# Patient Record
Sex: Female | Born: 1970 | Race: White | Hispanic: No | Marital: Single | Smoking: Never smoker
Health system: Southern US, Community
[De-identification: ages and names within clinical notes are randomized; demographics above are authoritative.]

## PROBLEM LIST (undated history)

## (undated) DIAGNOSIS — I10 Essential (primary) hypertension: Secondary | ICD-10-CM

## (undated) DIAGNOSIS — N200 Calculus of kidney: Secondary | ICD-10-CM

## (undated) HISTORY — PX: APPENDECTOMY: SHX54

## (undated) HISTORY — PX: CHOLECYSTECTOMY: SHX55

## (undated) HISTORY — PX: TONSILLECTOMY: SUR1361

---

## 2020-04-29 ENCOUNTER — Emergency Department (HOSPITAL_COMMUNITY)
Admission: EM | Admit: 2020-04-29 | Discharge: 2020-04-29 | Disposition: A | Discharge status: Home / Self Care | Payer: PRIVATE HEALTH INSURANCE | Attending: Emergency Medicine | Admitting: Emergency Medicine

## 2020-04-29 ENCOUNTER — Encounter (HOSPITAL_COMMUNITY): Payer: Self-pay | Admitting: Emergency Medicine

## 2020-04-29 ENCOUNTER — Emergency Department (HOSPITAL_COMMUNITY): Payer: PRIVATE HEALTH INSURANCE

## 2020-04-29 ENCOUNTER — Other Ambulatory Visit: Payer: Self-pay

## 2020-04-29 DIAGNOSIS — Z79899 Other long term (current) drug therapy: Secondary | ICD-10-CM | POA: Insufficient documentation

## 2020-04-29 DIAGNOSIS — R1032 Left lower quadrant pain: Secondary | ICD-10-CM | POA: Insufficient documentation

## 2020-04-29 DIAGNOSIS — Z87442 Personal history of urinary calculi: Secondary | ICD-10-CM | POA: Insufficient documentation

## 2020-04-29 DIAGNOSIS — R109 Unspecified abdominal pain: Secondary | ICD-10-CM

## 2020-04-29 DIAGNOSIS — I10 Essential (primary) hypertension: Secondary | ICD-10-CM | POA: Insufficient documentation

## 2020-04-29 HISTORY — DX: Essential (primary) hypertension: I10

## 2020-04-29 HISTORY — DX: Calculus of kidney: N20.0

## 2020-04-29 LAB — CBC
HCT: 30.5 % — ABNORMAL LOW (ref 36.0–46.0)
Hemoglobin: 8.6 g/dL — ABNORMAL LOW (ref 12.0–15.0)
MCH: 21.4 pg — ABNORMAL LOW (ref 26.0–34.0)
MCHC: 28.2 g/dL — ABNORMAL LOW (ref 30.0–36.0)
MCV: 75.9 fL — ABNORMAL LOW (ref 80.0–100.0)
Platelets: 298 10*3/uL (ref 150–400)
RBC: 4.02 MIL/uL (ref 3.87–5.11)
RDW: 17.2 % — ABNORMAL HIGH (ref 11.5–15.5)
WBC: 4.2 10*3/uL (ref 4.0–10.5)
nRBC: 0 % (ref 0.0–0.2)

## 2020-04-29 LAB — BASIC METABOLIC PANEL
Anion gap: 10 (ref 5–15)
BUN: 10 mg/dL (ref 6–20)
CO2: 23 mmol/L (ref 22–32)
Calcium: 9.2 mg/dL (ref 8.9–10.3)
Chloride: 104 mmol/L (ref 98–111)
Creatinine, Ser: 0.99 mg/dL (ref 0.44–1.00)
GFR calc Af Amer: >60 mL/min (ref >60)
GFR calc non Af Amer: >60 mL/min (ref >60)
Glucose, Bld: 109 mg/dL — ABNORMAL HIGH (ref 70–99)
Potassium: 4.3 mmol/L (ref 3.5–5.1)
Sodium: 137 mmol/L (ref 135–145)

## 2020-04-29 LAB — URINALYSIS, ROUTINE W REFLEX MICROSCOPIC
Bilirubin Urine: NEGATIVE
Glucose, UA: NEGATIVE mg/dL
Ketones, ur: NEGATIVE mg/dL
Leukocytes,Ua: NEGATIVE
Nitrite: NEGATIVE
Protein, ur: 30 mg/dL — AB
RBC / HPF: >50 RBC/hpf — ABNORMAL HIGH (ref 0–5)
Specific Gravity, Urine: 1.002 — ABNORMAL LOW (ref 1.005–1.030)
pH: 6 (ref 5.0–8.0)

## 2020-04-29 MED ORDER — OXYCODONE-ACETAMINOPHEN 5-325 MG PO TABS
1.0000 | ORAL_TABLET | Freq: Once | ORAL | Status: AC
  Administered 2020-04-29: 1 via ORAL
  Filled 2020-04-29: qty 1

## 2020-04-29 MED ORDER — OXYCODONE-ACETAMINOPHEN 5-325 MG PO TABS: 1.0000 | ORAL_TABLET | Freq: Four times a day (QID) | ORAL | 0 refills | Status: AC | PRN

## 2020-04-29 MED ORDER — ACETAMINOPHEN 500 MG PO TABS: 500.0000 mg | ORAL_TABLET | Freq: Four times a day (QID) | ORAL | 0 refills | Status: AC | PRN

## 2020-04-29 MED ORDER — FENTANYL CITRATE (PF) 100 MCG/2ML IJ SOLN
50.0000 ug | Freq: Once | INTRAMUSCULAR | Status: AC
  Administered 2020-04-29: 50 ug via INTRAVENOUS
  Filled 2020-04-29: qty 2

## 2020-04-29 MED ORDER — NAPROXEN 500 MG PO TABS: 500.0000 mg | ORAL_TABLET | Freq: Two times a day (BID) | ORAL | 0 refills | Status: AC | PRN

## 2020-04-29 MED ORDER — FERROUS SULFATE 325 (65 FE) MG PO TABS: 325.0000 mg | ORAL_TABLET | Freq: Every day | ORAL | 0 refills | Status: AC

## 2020-04-29 MED ORDER — KETOROLAC TROMETHAMINE 30 MG/ML IJ SOLN
30.0000 mg | Freq: Once | INTRAMUSCULAR | Status: AC
  Administered 2020-04-29: 30 mg via INTRAVENOUS
  Filled 2020-04-29: qty 1

## 2020-04-29 NOTE — Discharge Instructions (Addendum)
Please take your medications, as prescribed.  Please take the Percocet only if your pain is uncontrolled by naproxen and Tylenol.  I have also prescribed you ferrous sulfate for your anemia here in the ED.  Given that we are uncertain as to your baseline, I would like for you to call your primary care provider back in [place] to assess what your current hemoglobin of 8.6 is significantly below your baseline.  If it is, you may need to return to the ED for further evaluation.  However, given that you do not have any dark black stools or recent menses, the etiology would be unclear.  I would like for you to have your laboratory values rechecked in 3 to 5 days to ensure that there is improvement with the ferrous sulfate.  Return to the ED or seek immediate medical attention should he develop any new or worsening symptoms.  You were given narcotic and or sedative medications while in the emergency department. Do not drive. Do not use machinery or power tools. Do not sign legal documents. Do not drink alcohol. Do not take sleeping pills. Do not supervise children by yourself. Do not participate in activities that require climbing or being in high places.

## 2020-04-29 NOTE — ED Triage Notes (Signed)
C/o L flank pain since yesterday with hematuria.  History of kidney stones.  Denies dysuria.

## 2020-04-29 NOTE — ED Provider Notes (Signed)
[facility] EMERGENCY DEPARTMENT Provider Note   CSN: [id number] Arrival date & time: 04/29/20  1121     History Chief Complaint  Patient presents with   Flank Pain    Amanda Hensley is a 49 y.o. female with PMH significant for HTN and kidney stones presents to the ED with complaints of left-sided flank pain since yesterday.  Patient is not here for work from [place] and she states that she has had dozens and dozens of kidney stones throughout her life, beginning at the age of 55.  She is followed by a urologist in [place].  She states that her left-sided flank pain began yesterday morning and she was able to manage her symptoms with naproxen and some previously prescribed Percocet, however when she ran out she knew that she needed to come to the ED for evaluation and pain control.  She also notes that she has extensive history of abdominal surgeries, but last bowel movement was last night and soft, brown.  She denies any fevers or chills, chest pain or difficulty breathing, cough, nausea or vomiting, dysuria, vaginal discharge, pelvic pain, or change in bowel habits.  She states that her flank discomfort radiates into LLQ and is consistent with her history of stones.  HPI     Past Medical History:  Diagnosis Date   Hypertension    Kidney stones     There are no problems to display for this patient.   Past Surgical History:  Procedure Laterality Date   APPENDECTOMY     CHOLECYSTECTOMY     TONSILLECTOMY       OB History   No obstetric history on file.     No family history on file.  Social History   Tobacco Use   Smoking status: Never Smoker   Smokeless tobacco: Never Used  Substance Use Topics   Alcohol use: Yes   Drug use: Never    Home Medications Prior to Admission medications   Medication Sig Start Date End Date Taking? Authorizing Provider  lisinopril (ZESTRIL) 20 MG tablet Take 20 mg by mouth daily.   Yes [provider]  naproxen sodium (ALEVE) 220 MG tablet Take 440 mg by mouth as needed (pain).   Yes [provider]  acetaminophen (TYLENOL) 500 MG tablet Take 1 tablet (500 mg total) by mouth every 6 (six) hours as needed for moderate pain. 04/29/20   Lorelee New, PA-C  ferrous sulfate 325 (65 FE) MG tablet Take 1 tablet (325 mg total) by mouth daily. 04/29/20   Lorelee New, PA-C  naproxen (NAPROSYN) 500 MG tablet Take 1 tablet (500 mg total) by mouth 2 (two) times daily as needed for moderate pain. 04/29/20   Lorelee New, PA-C  oxyCODONE-acetaminophen (PERCOCET/ROXICET) 5-325 MG tablet Take 1 tablet by mouth every 6 (six) hours as needed for up to 5 doses for severe pain. 04/29/20   Lorelee New, PA-C    Allergies    Patient has no known allergies.  Review of Systems   Review of Systems  All other systems reviewed and are negative.   Physical Exam Updated Vital Signs BP (!) 154/102    Pulse (!) 47    Temp 98 F (36.7 C) (Oral)    Resp (!) 22    SpO2 100%   Physical Exam Vitals and nursing note reviewed. Exam conducted with a chaperone present.  Constitutional:      Comments: In obvious discomfort.  HENT:  Head: Normocephalic and atraumatic.  Eyes:     General: No scleral icterus.    Conjunctiva/sclera: Conjunctivae normal.  Cardiovascular:     Rate and Rhythm: Normal rate and regular rhythm.     Pulses: Normal pulses.     Heart sounds: Normal heart sounds.  Pulmonary:     Effort: Pulmonary effort is normal. No respiratory distress.     Breath sounds: Normal breath sounds. No wheezing.  Abdominal:     Comments: Soft, nondistended.  Left-sided CVAT.  No abdominal tenderness.  No guarding.  Musculoskeletal:     Cervical back: Normal range of motion. No rigidity.  Skin:    General: Skin is dry.     Capillary Refill: Capillary refill takes less than 2 seconds.  Neurological:     Mental Status: She is alert and oriented to person, place, and time.      GCS: GCS eye subscore is 4. GCS verbal subscore is 5. GCS motor subscore is 6.  Psychiatric:        Mood and Affect: Mood normal.        Behavior: Behavior normal.        Thought Content: Thought content normal.      ED Results / Procedures / Treatments   Labs (all labs ordered are listed, but only abnormal results are displayed) Labs Reviewed  URINALYSIS, ROUTINE W REFLEX MICROSCOPIC - Abnormal; Notable for the following components:      Result Value   Color, Urine AMBER (*)    Specific Gravity, Urine 1.002 (*)    Hgb urine dipstick LARGE (*)    Protein, ur 30 (*)    RBC / HPF >50 (*)    Bacteria, UA RARE (*)    All other components within normal limits  BASIC METABOLIC PANEL - Abnormal; Notable for the following components:   Glucose, Bld 109 (*)    All other components within normal limits  CBC - Abnormal; Notable for the following components:   Hemoglobin 8.6 (*)    HCT 30.5 (*)    MCV 75.9 (*)    MCH 21.4 (*)    MCHC 28.2 (*)    RDW 17.2 (*)    All other components within normal limits    EKG None  Radiology CT Renal Stone Study  Result Date: 04/29/2020 CLINICAL DATA:  Acute left flank and left lower quadrant pain EXAM: CT ABDOMEN AND PELVIS WITHOUT CONTRAST TECHNIQUE: Multidetector CT imaging of the abdomen and pelvis was performed following the standard protocol without IV contrast. COMPARISON:  None. FINDINGS: Lower chest: No acute abnormality. Hepatobiliary: No focal liver abnormality is seen. Status post cholecystectomy. No biliary dilatation. Pancreas: Unremarkable. No pancreatic ductal dilatation or surrounding inflammatory changes. Spleen: Normal in size without focal abnormality. Adrenals/Urinary Tract: Adrenal glands are unremarkable. Kidneys are normal, without renal calculi, focal lesion, or hydronephrosis. Bladder is unremarkable. Stomach/Bowel: Negative for bowel obstruction, significant dilatation, ileus, free air. Remote appendectomy. Scattered colonic  diverticulosis without acute inflammatory process. No free fluid, fluid collection, hemorrhage, hematoma, abscess, or ascites. Vascular/Lymphatic: Minor aortic atherosclerosis. Negative for aneurysm. No retroperitoneal hemorrhage or hematoma. No bulky adenopathy. Reproductive: Uterus and bilateral adnexa are unremarkable. Other: No abdominal wall hernia or abnormality. No abdominopelvic ascites. Musculoskeletal: Degenerative changes noted of the spine. IMPRESSION: No acute obstructive urinary tract or ureteral calculus. Negative for hydronephrosis or acute obstructive uropathy. No other acute intra-abdominal or pelvic finding by noncontrast CT. Remote cholecystectomy and appendectomy Diverticulosis without acute inflammatory process Aortic Atherosclerosis (ICD10-I70.0).  Electronically Signed   By: Judie Petit.  Shick M.D.   On: 04/29/2020 13:36    Procedures Procedures (including critical care time)  Medications Ordered in ED Medications  oxyCODONE-acetaminophen (PERCOCET/ROXICET) 5-325 MG per tablet 1 tablet (has no administration in time range)  fentaNYL (SUBLIMAZE) injection 50 mcg (50 mcg Intravenous Given 04/29/20 1314)  ketorolac (TORADOL) 30 MG/ML injection 30 mg (30 mg Intravenous Given 04/29/20 1346)    ED Course  I have reviewed the triage vital signs and the nursing notes.  Pertinent labs & imaging results that were available during my care of the patient were reviewed by me and considered in my medical decision making (see chart for details).    MDM Rules/Calculators/A&P                          Patient's history and physical exam is consistent with obstructing ureterolithiasis.  She states that she has had countless amount of obstructing kidney stones since the age of 41, which she attributes to her father.  She has been followed by urology in [place] where she is from.  She has never been evaluated here in [place] and there are no medical records with which to compare.  Will obtain  basic laboratory work-up to ensure that she does not have impaired renal function prior to administration of Toradol.  In the meantime, we will treat her 10 on 10 pain with fentanyl.  She is tachycardic here in the ED, however I suspect that is attributed to her pain symptoms.  UA is reviewed and demonstrates no evidence of infection, however large amount of blood consistent with obstructing stone.  Will obtain CT renal stone study for further evaluation.  CT renal stone study demonstrates no acute obstructive ureteral stone or other intra-abdominal pathology.  Interestingly, no obvious nonobstructing renal stones bilaterally.  Patient has noted that her stones are typically small and have been missed in the past.  Based on her history and my exam, I suspect that this likely was a stone that is in process of being passed.  She states that her pain is improved from 10 out of 10 to 6 out of 10 with Toradol and fentanyl here in the ED.  She is still in some discomfort, so we will give her a Percocet here in the ED prior to discharge.  We will send her home with a short course of Percocet in addition to naproxen and Tylenol.  She has not returned home to [place] for another couple of weeks.   Patient's CBC was notable for hemoglobin of 8.6, however no labs with which to compare.  She states that she has been anemic in the past and that she has improved with ferrous sulfate.  She will call her primary care provider regarding today's findings to ensure that it is not grossly below her baseline.  She denies any headache or lightheadedness.  No melena or recent menses.  Strict ED return precautions discussed with patient.  All of the evaluation and work-up results were discussed with the patient and any family at bedside. They were provided opportunity to ask any additional questions and have none at this time. They have expressed understanding of verbal discharge instructions as well as return precautions and are  agreeable to the plan.    Final Clinical Impression(s) / ED Diagnoses Final diagnoses:  Left flank pain    Rx / DC Orders ED Discharge Orders  Ordered    oxyCODONE-acetaminophen (PERCOCET/ROXICET) 5-325 MG tablet  Every 6 hours PRN     Discontinue  Reprint     04/29/20 1421    naproxen (NAPROSYN) 500 MG tablet  2 times daily PRN     Discontinue  Reprint     04/29/20 1421    acetaminophen (TYLENOL) 500 MG tablet  Every 6 hours PRN     Discontinue  Reprint     04/29/20 1421    ferrous sulfate 325 (65 FE) MG tablet  Daily     Discontinue  Reprint     04/29/20 1421           Lorelee New, PA-C 04/29/20 1422    Mancel Bale, MD 04/29/20 1951

## 2020-04-29 NOTE — ED Notes (Signed)
Pt is at ct.

## 2021-01-16 IMAGING — CT CT RENAL STONE PROTOCOL
2 of 4 series · 17 of 46 positions shown, 19 images · non-contrast
Comparison: None.

CLINICAL DATA: Acute left flank and left lower quadrant pain

EXAM:
CT ABDOMEN AND PELVIS WITHOUT CONTRAST
TECHNIQUE: Multidetector CT imaging of the abdomen and pelvis was performed
following the standard protocol without IV contrast.

[Series 3: ap without · axial · non-contrast · 0.86mm/px · z∈[+747,+1152]mm · 14 of 91 slices shown, 16 images]
[im 5/91  soft-tissue]
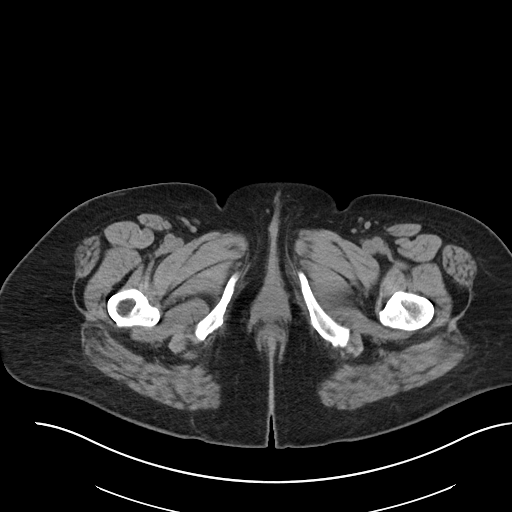
[im 5/91  bone]
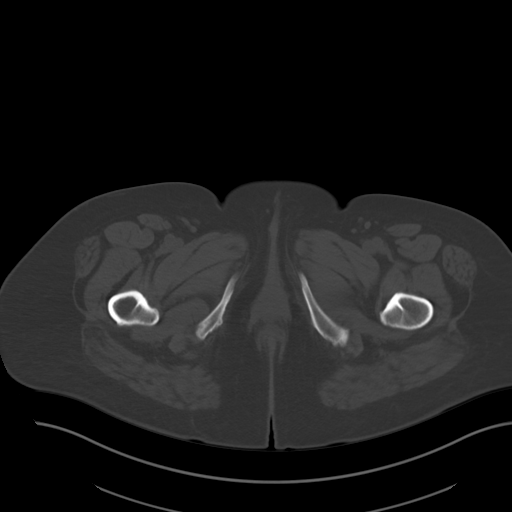
[im 14/91  soft-tissue]
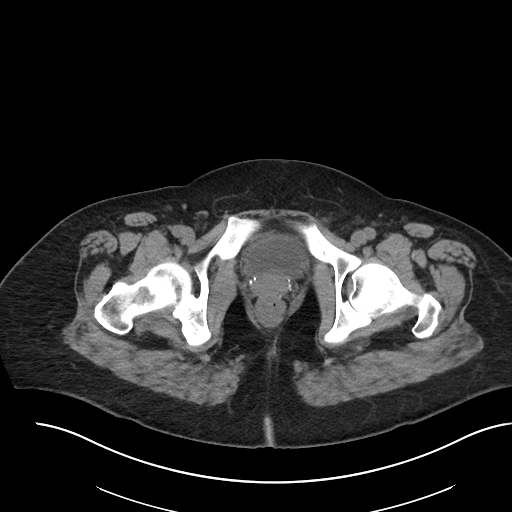
[im 19/91  soft-tissue]
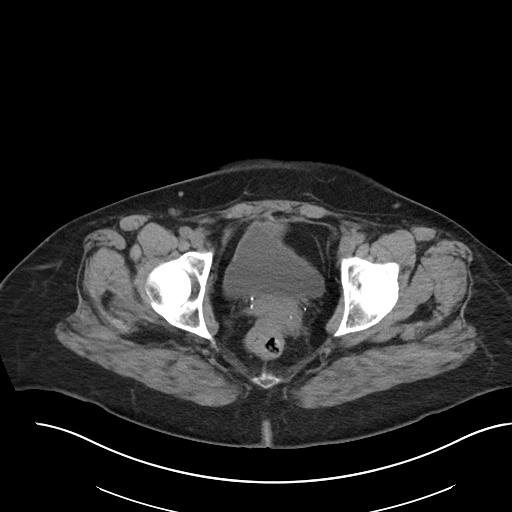
[im 23/91  soft-tissue]
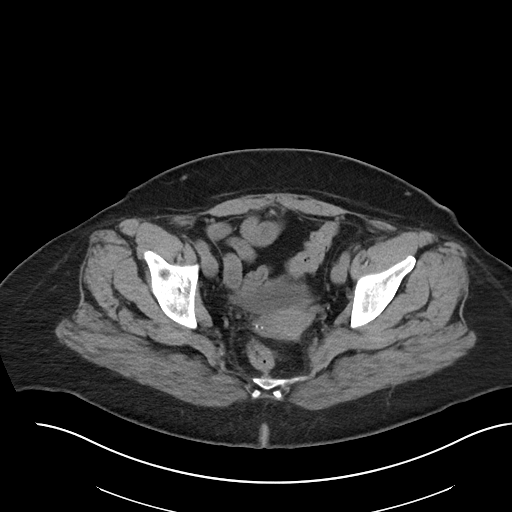
[im 32/91  soft-tissue]
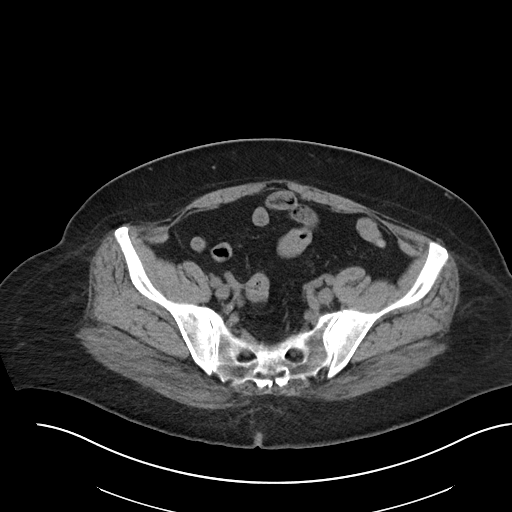
[im 37/91  soft-tissue]
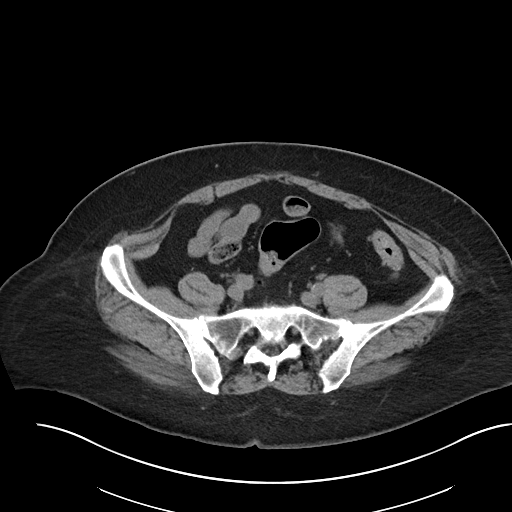
[im 41/91  soft-tissue]
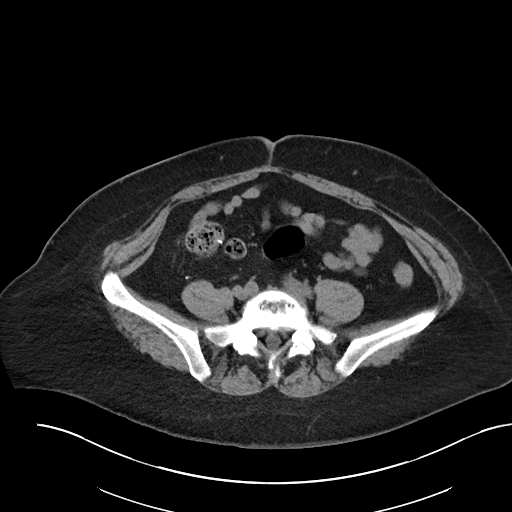
[im 50/91  soft-tissue]
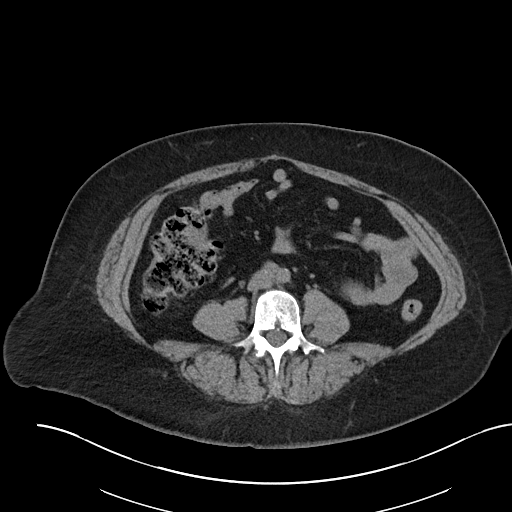
[im 55/91  soft-tissue]
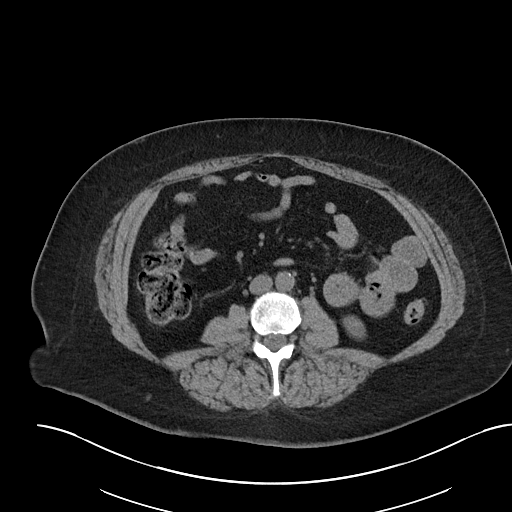
[im 55/91  bone]
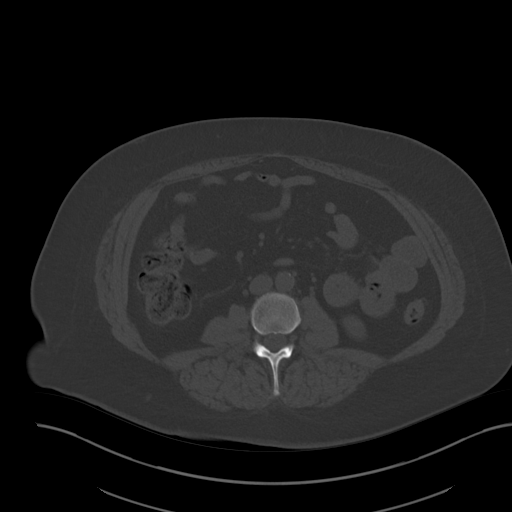
[im 59/91  soft-tissue]
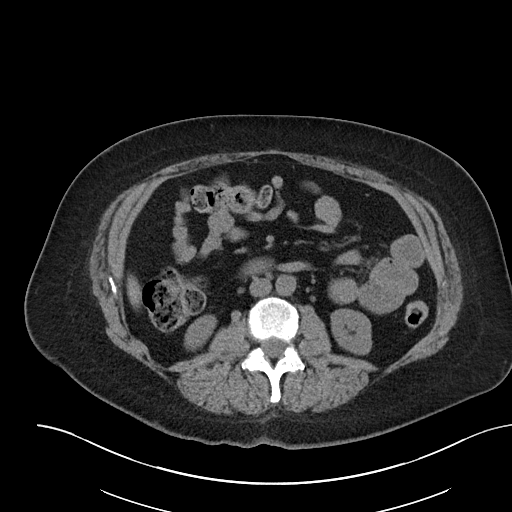
[im 68/91  soft-tissue]
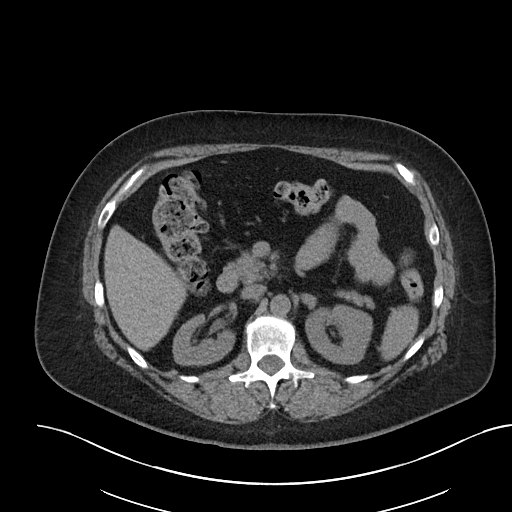
[im 73/91  soft-tissue]
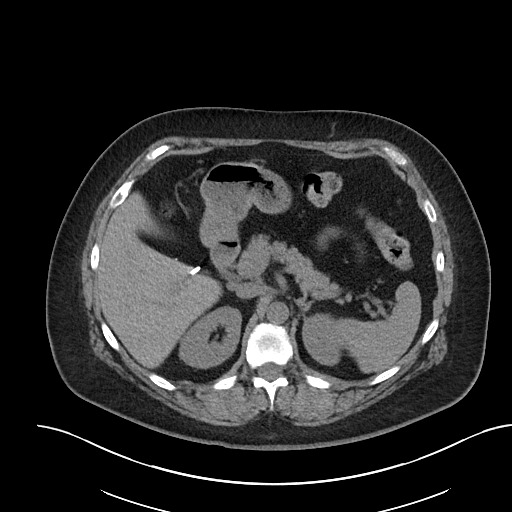
[im 77/91  soft-tissue]
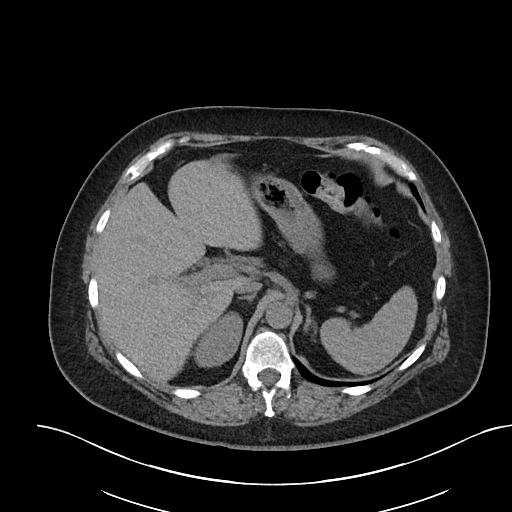
[im 86/91  soft-tissue]
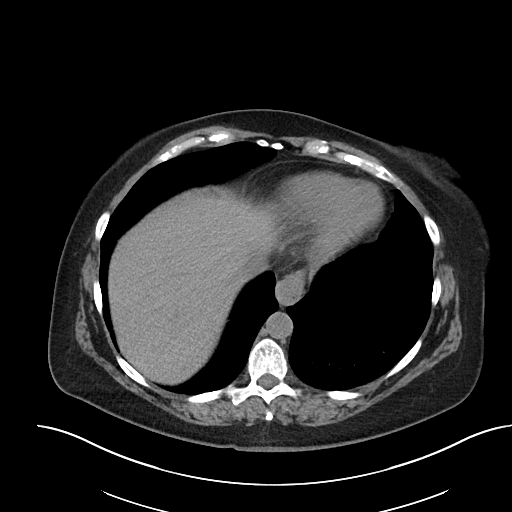

[Series 6: cor · coronal · 0.90mm/px · 3 of 100 slices shown]
[im 34/100  soft-tissue]
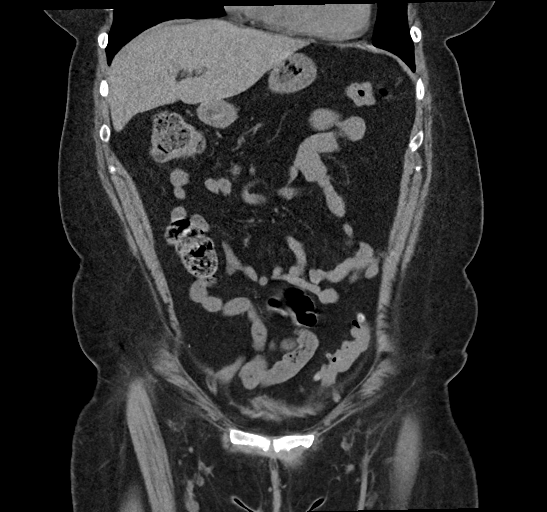
[im 45/100  soft-tissue]
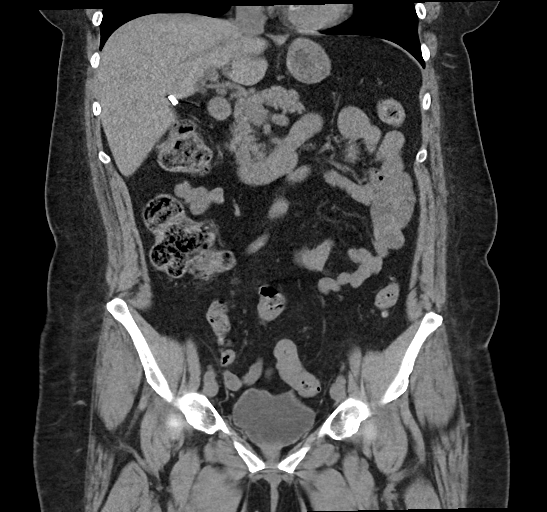
[im 56/100  soft-tissue]
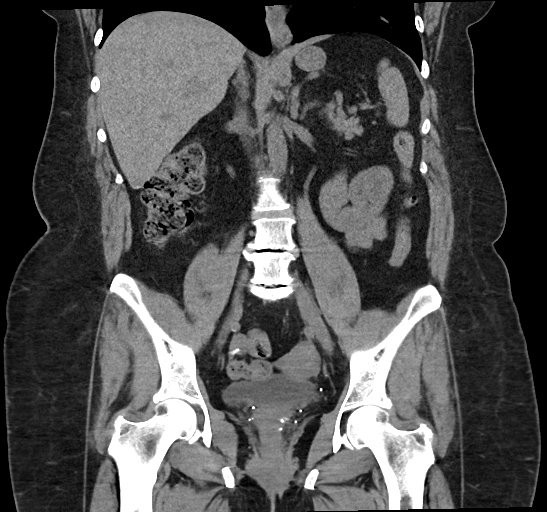

[17 of 46 positions shown; findings below may reference images not displayed]

FINDINGS: Lower chest: No acute abnormality.

Hepatobiliary: No focal liver abnormality is seen. Status post
cholecystectomy. No biliary dilatation.

Pancreas: Unremarkable. No pancreatic ductal dilatation or
surrounding inflammatory changes.

Spleen: Normal in size without focal abnormality.

Adrenals/Urinary Tract: Adrenal glands are unremarkable. Kidneys are
normal, without renal calculi, focal lesion, or hydronephrosis.
Bladder is unremarkable.

Stomach/Bowel: Negative for bowel obstruction, significant
dilatation, ileus, free air. Remote appendectomy. Scattered colonic
diverticulosis without acute inflammatory process.

No free fluid, fluid collection, hemorrhage, hematoma, abscess, or
ascites.

Vascular/Lymphatic: Minor aortic atherosclerosis. Negative for
aneurysm. No retroperitoneal hemorrhage or hematoma. No bulky
adenopathy.

Reproductive: Uterus and bilateral adnexa are unremarkable.

Other: No abdominal wall hernia or abnormality. No abdominopelvic
ascites.

Musculoskeletal: Degenerative changes noted of the spine.
IMPRESSION: No acute obstructive urinary tract or ureteral calculus. Negative
for hydronephrosis or acute obstructive uropathy.

No other acute intra-abdominal or pelvic finding by noncontrast CT.

Remote cholecystectomy and appendectomy

Diverticulosis without acute inflammatory process

Aortic Atherosclerosis ([id number]).
